# Patient Record
Sex: Male | Born: 1950 | Race: White | Hispanic: No
Health system: Southern US, Community
[De-identification: ages and names within clinical notes are randomized; demographics above are authoritative.]

---

## 2010-05-12 ENCOUNTER — Ambulatory Visit: Payer: Self-pay | Admitting: Specialist

## 2011-04-20 ENCOUNTER — Ambulatory Visit: Payer: Self-pay | Admitting: General Surgery

## 2011-04-28 ENCOUNTER — Other Ambulatory Visit: Payer: Self-pay | Admitting: General Surgery

## 2011-04-28 LAB — COMPREHENSIVE METABOLIC PANEL
Alkaline Phosphatase: 96 U/L (ref 50–136)
Anion Gap: 12 (ref 7–16)
Bilirubin,Total: 0.7 mg/dL (ref 0.2–1.0)
Calcium, Total: 9.5 mg/dL (ref 8.5–10.1)
Chloride: 104 mmol/L (ref 98–107)
Co2: 25 mmol/L (ref 21–32)
Creatinine: 0.59 mg/dL — ABNORMAL LOW (ref 0.60–1.30)
EGFR (African American): >60
EGFR (Non-African Amer.): >60
Osmolality: 281 (ref 275–301)
Potassium: 4.5 mmol/L (ref 3.5–5.1)
SGPT (ALT): 81 U/L — ABNORMAL HIGH
Sodium: 141 mmol/L (ref 136–145)

## 2011-04-28 LAB — CBC WITH DIFFERENTIAL/PLATELET
Basophil #: 0 10*3/uL (ref 0.0–0.1)
Basophil %: 0.4 %
Eosinophil %: 1 %
HCT: 49.4 % (ref 40.0–52.0)
HGB: 16.9 g/dL (ref 13.0–18.0)
Lymphocyte #: 2.2 10*3/uL (ref 1.0–3.6)
Lymphocyte %: 18.7 %
MCV: 96 fL (ref 80–100)
Monocyte %: 6.7 %
Platelet: 211 10*3/uL (ref 150–440)
RBC: 5.14 10*6/uL (ref 4.40–5.90)
RDW: 13 % (ref 11.5–14.5)
WBC: 11.7 10*3/uL — ABNORMAL HIGH (ref 3.8–10.6)

## 2011-04-29 LAB — CEA: CEA: 19.1 ng/mL — ABNORMAL HIGH (ref 0.0–4.7)

## 2011-05-01 ENCOUNTER — Ambulatory Visit: Payer: Self-pay | Admitting: General Surgery

## 2011-05-05 ENCOUNTER — Ambulatory Visit: Payer: Self-pay | Admitting: Oncology

## 2011-05-14 LAB — CBC CANCER CENTER
Basophil #: 0 x10 3/mm (ref 0.0–0.1)
Eosinophil %: 1.6 %
HCT: 46.3 % (ref 40.0–52.0)
Lymphocyte #: 2.2 x10 3/mm (ref 1.0–3.6)
Lymphocyte %: 23.9 %
MCV: 96 fL (ref 80–100)
Monocyte %: 11.3 %
Neutrophil #: 5.8 x10 3/mm (ref 1.4–6.5)
RBC: 4.84 10*6/uL (ref 4.40–5.90)
WBC: 9.2 x10 3/mm (ref 3.8–10.6)

## 2011-05-14 LAB — COMPREHENSIVE METABOLIC PANEL
Anion Gap: 9 (ref 7–16)
Calcium, Total: 9.2 mg/dL (ref 8.5–10.1)
Chloride: 101 mmol/L (ref 98–107)
EGFR (African American): >60
Glucose: 165 mg/dL — ABNORMAL HIGH (ref 65–99)
Osmolality: 278 (ref 275–301)
Potassium: 4.4 mmol/L (ref 3.5–5.1)
SGOT(AST): 32 U/L (ref 15–37)
Sodium: 138 mmol/L (ref 136–145)

## 2011-05-21 LAB — COMPREHENSIVE METABOLIC PANEL
Albumin: 3.2 g/dL — ABNORMAL LOW (ref 3.4–5.0)
Alkaline Phosphatase: 156 U/L — ABNORMAL HIGH (ref 50–136)
BUN: 10 mg/dL (ref 7–18)
Bilirubin,Total: 0.5 mg/dL (ref 0.2–1.0)
Calcium, Total: 9.3 mg/dL (ref 8.5–10.1)
Co2: 28 mmol/L (ref 21–32)
Creatinine: 0.89 mg/dL (ref 0.60–1.30)
EGFR (Non-African Amer.): >60
Glucose: 182 mg/dL — ABNORMAL HIGH (ref 65–99)
Osmolality: 274 (ref 275–301)
SGOT(AST): 28 U/L (ref 15–37)
SGPT (ALT): 51 U/L
Sodium: 135 mmol/L — ABNORMAL LOW (ref 136–145)

## 2011-05-21 LAB — CBC CANCER CENTER
Eosinophil %: 1.4 %
HCT: 44.9 % (ref 40.0–52.0)
HGB: 15.3 g/dL (ref 13.0–18.0)
Lymphocyte #: 2 x10 3/mm (ref 1.0–3.6)
Lymphocyte %: 23.8 %
Monocyte #: 1 x10 3/mm — ABNORMAL HIGH (ref 0.0–0.7)
RBC: 4.69 10*6/uL (ref 4.40–5.90)
WBC: 8.4 x10 3/mm (ref 3.8–10.6)

## 2011-05-28 LAB — CBC CANCER CENTER
Basophil #: 0.1 x10 3/mm (ref 0.0–0.1)
Eosinophil #: 0.1 x10 3/mm (ref 0.0–0.7)
Eosinophil %: 1 %
HCT: 45.1 % (ref 40.0–52.0)
Lymphocyte %: 28.6 %
Monocyte %: 15.5 %
Neutrophil #: 2.8 x10 3/mm (ref 1.4–6.5)
Platelet: 285 x10 3/mm (ref 150–440)
RBC: 4.76 10*6/uL (ref 4.40–5.90)
RDW: 12.8 % (ref 11.5–14.5)
WBC: 5.1 x10 3/mm (ref 3.8–10.6)

## 2011-05-28 LAB — COMPREHENSIVE METABOLIC PANEL
Anion Gap: 12 (ref 7–16)
BUN: 8 mg/dL (ref 7–18)
Calcium, Total: 9.2 mg/dL (ref 8.5–10.1)
Chloride: 101 mmol/L (ref 98–107)
Creatinine: 0.71 mg/dL (ref 0.60–1.30)
EGFR (African American): >60
EGFR (Non-African Amer.): >60
Glucose: 143 mg/dL — ABNORMAL HIGH (ref 65–99)
Osmolality: 275 (ref 275–301)
Potassium: 4.2 mmol/L (ref 3.5–5.1)
SGOT(AST): 29 U/L (ref 15–37)
Total Protein: 7.4 g/dL (ref 6.4–8.2)

## 2011-05-29 ENCOUNTER — Ambulatory Visit: Payer: Self-pay | Admitting: Oncology

## 2011-05-29 LAB — CEA: CEA: 41.8 ng/mL — ABNORMAL HIGH (ref 0.0–4.7)

## 2011-05-29 LAB — CANCER ANTIGEN 19-9: CA 19-9: 270 U/mL — ABNORMAL HIGH (ref 0–35)

## 2011-06-16 ENCOUNTER — Ambulatory Visit: Payer: Self-pay | Admitting: General Surgery

## 2011-06-29 ENCOUNTER — Ambulatory Visit: Payer: Self-pay | Admitting: Oncology

## 2011-07-06 ENCOUNTER — Ambulatory Visit: Payer: Self-pay | Admitting: Oncology

## 2011-07-09 LAB — COMPREHENSIVE METABOLIC PANEL
Alkaline Phosphatase: 124 U/L (ref 50–136)
BUN: 9 mg/dL (ref 7–18)
Bilirubin,Total: 0.4 mg/dL (ref 0.2–1.0)
Calcium, Total: 8.9 mg/dL (ref 8.5–10.1)
Co2: 23 mmol/L (ref 21–32)
Creatinine: 0.76 mg/dL (ref 0.60–1.30)
EGFR (Non-African Amer.): >60
Glucose: 169 mg/dL — ABNORMAL HIGH (ref 65–99)
Osmolality: 280 (ref 275–301)
Potassium: 3.8 mmol/L (ref 3.5–5.1)
SGOT(AST): 50 U/L — ABNORMAL HIGH (ref 15–37)
SGPT (ALT): 66 U/L
Sodium: 139 mmol/L (ref 136–145)

## 2011-07-09 LAB — CBC CANCER CENTER
Basophil #: 0 x10 3/mm (ref 0.0–0.1)
Basophil %: 0.1 %
Eosinophil #: 0 x10 3/mm (ref 0.0–0.7)
HCT: 40.1 % (ref 40.0–52.0)
HGB: 13.3 g/dL (ref 13.0–18.0)
Lymphocyte %: 20.2 %
MCH: 32.6 pg (ref 26.0–34.0)
MCHC: 33.1 g/dL (ref 32.0–36.0)
Monocyte %: 8.8 %
Platelet: 207 x10 3/mm (ref 150–440)
RBC: 4.07 10*6/uL — ABNORMAL LOW (ref 4.40–5.90)
RDW: 15.6 % — ABNORMAL HIGH (ref 11.5–14.5)
WBC: 7.4 x10 3/mm (ref 3.8–10.6)

## 2011-07-16 LAB — COMPREHENSIVE METABOLIC PANEL
Alkaline Phosphatase: 122 U/L (ref 50–136)
BUN: 7 mg/dL (ref 7–18)
Bilirubin,Total: 0.4 mg/dL (ref 0.2–1.0)
Chloride: 105 mmol/L (ref 98–107)
Creatinine: 0.61 mg/dL (ref 0.60–1.30)
EGFR (African American): >60
EGFR (Non-African Amer.): >60
Glucose: 122 mg/dL — ABNORMAL HIGH (ref 65–99)
Osmolality: 279 (ref 275–301)
Potassium: 3.9 mmol/L (ref 3.5–5.1)
Sodium: 140 mmol/L (ref 136–145)
Total Protein: 7.6 g/dL (ref 6.4–8.2)

## 2011-07-16 LAB — CBC CANCER CENTER
Basophil #: 0 x10 3/mm (ref 0.0–0.1)
Basophil %: 0.7 %
Eosinophil #: 0.1 x10 3/mm (ref 0.0–0.7)
Eosinophil %: 1.1 %
HCT: 41.3 % (ref 40.0–52.0)
Lymphocyte #: 1.3 x10 3/mm (ref 1.0–3.6)
Lymphocyte %: 21.2 %
MCH: 32.5 pg (ref 26.0–34.0)
MCHC: 33.5 g/dL (ref 32.0–36.0)
MCV: 97 fL (ref 80–100)
Platelet: 211 x10 3/mm (ref 150–440)
RDW: 15.8 % — ABNORMAL HIGH (ref 11.5–14.5)
WBC: 6.2 x10 3/mm (ref 3.8–10.6)

## 2011-07-17 LAB — CEA: CEA: 4.5 ng/mL (ref 0.0–4.7)

## 2011-07-23 LAB — COMPREHENSIVE METABOLIC PANEL
Albumin: 3.5 g/dL (ref 3.4–5.0)
Alkaline Phosphatase: 163 U/L — ABNORMAL HIGH (ref 50–136)
Anion Gap: 12 (ref 7–16)
BUN: 9 mg/dL (ref 7–18)
Bilirubin,Total: 0.4 mg/dL (ref 0.2–1.0)
Calcium, Total: 9.4 mg/dL (ref 8.5–10.1)
Chloride: 100 mmol/L (ref 98–107)
Co2: 26 mmol/L (ref 21–32)
Creatinine: 0.65 mg/dL (ref 0.60–1.30)
EGFR (African American): >60
Glucose: 161 mg/dL — ABNORMAL HIGH (ref 65–99)
Osmolality: 278 (ref 275–301)
SGOT(AST): 42 U/L — ABNORMAL HIGH (ref 15–37)
Sodium: 138 mmol/L (ref 136–145)
Total Protein: 7.2 g/dL (ref 6.4–8.2)

## 2011-07-23 LAB — CBC CANCER CENTER
Basophil #: 0 x10 3/mm (ref 0.0–0.1)
Eosinophil #: 0 x10 3/mm (ref 0.0–0.7)
Eosinophil %: 0.6 %
Lymphocyte #: 1.7 x10 3/mm (ref 1.0–3.6)
Lymphocyte %: 23.9 %
MCH: 32 pg (ref 26.0–34.0)
MCV: 98 fL (ref 80–100)
Monocyte #: 2.2 x10 3/mm — ABNORMAL HIGH (ref 0.2–1.0)
Monocyte %: 30.1 %
Neutrophil %: 44.7 %
RBC: 3.93 10*6/uL — ABNORMAL LOW (ref 4.40–5.90)
WBC: 7.3 x10 3/mm (ref 3.8–10.6)

## 2011-07-29 ENCOUNTER — Ambulatory Visit: Payer: Self-pay | Admitting: Oncology

## 2011-08-06 LAB — CBC CANCER CENTER
Basophil %: 0.5 %
Eosinophil #: 0 x10 3/mm (ref 0.0–0.7)
HGB: 12.9 g/dL — ABNORMAL LOW (ref 13.0–18.0)
Lymphocyte #: 1.6 x10 3/mm (ref 1.0–3.6)
Lymphocyte %: 17.8 %
MCH: 33.1 pg (ref 26.0–34.0)
MCV: 97 fL (ref 80–100)
Monocyte %: 16 %
Platelet: 197 x10 3/mm (ref 150–440)
RBC: 3.89 10*6/uL — ABNORMAL LOW (ref 4.40–5.90)
RDW: 16.4 % — ABNORMAL HIGH (ref 11.5–14.5)

## 2011-08-06 LAB — COMPREHENSIVE METABOLIC PANEL
Albumin: 3.4 g/dL (ref 3.4–5.0)
Anion Gap: 12 (ref 7–16)
Chloride: 103 mmol/L (ref 98–107)
Creatinine: 0.59 mg/dL — ABNORMAL LOW (ref 0.60–1.30)
EGFR (African American): >60
Osmolality: 282 (ref 275–301)
SGOT(AST): 57 U/L — ABNORMAL HIGH (ref 15–37)
SGPT (ALT): 79 U/L — ABNORMAL HIGH
Sodium: 140 mmol/L (ref 136–145)
Total Protein: 7.3 g/dL (ref 6.4–8.2)

## 2011-08-29 ENCOUNTER — Ambulatory Visit: Payer: Self-pay | Admitting: Oncology

## 2011-09-17 LAB — COMPREHENSIVE METABOLIC PANEL
Albumin: 3.4 g/dL (ref 3.4–5.0)
Anion Gap: 9 (ref 7–16)
BUN: 7 mg/dL (ref 7–18)
Bilirubin,Total: 0.6 mg/dL (ref 0.2–1.0)
Chloride: 103 mmol/L (ref 98–107)
Co2: 25 mmol/L (ref 21–32)
Creatinine: 0.59 mg/dL — ABNORMAL LOW (ref 0.60–1.30)
EGFR (African American): >60
EGFR (Non-African Amer.): >60
Glucose: 102 mg/dL — ABNORMAL HIGH (ref 65–99)
Osmolality: 272 (ref 275–301)
SGOT(AST): 70 U/L — ABNORMAL HIGH (ref 15–37)
Sodium: 137 mmol/L (ref 136–145)
Total Protein: 7.6 g/dL (ref 6.4–8.2)

## 2011-09-17 LAB — CBC CANCER CENTER
Basophil %: 0.4 %
Eosinophil #: 0.1 x10 3/mm (ref 0.0–0.7)
HCT: 36.7 % — ABNORMAL LOW (ref 40.0–52.0)
HGB: 12.5 g/dL — ABNORMAL LOW (ref 13.0–18.0)
Lymphocyte #: 1.6 x10 3/mm (ref 1.0–3.6)
Lymphocyte %: 26 %
MCHC: 34.1 g/dL (ref 32.0–36.0)
MCV: 101 fL — ABNORMAL HIGH (ref 80–100)
Neutrophil %: 60.5 %
Platelet: 219 x10 3/mm (ref 150–440)
RBC: 3.64 10*6/uL — ABNORMAL LOW (ref 4.40–5.90)
WBC: 6.2 x10 3/mm (ref 3.8–10.6)

## 2011-09-18 LAB — CANCER ANTIGEN 19-9: CA 19-9: 104 U/mL — ABNORMAL HIGH (ref 0–35)

## 2011-09-18 LAB — CEA: CEA: 4.6 ng/mL (ref 0.0–4.7)

## 2011-09-28 ENCOUNTER — Ambulatory Visit: Payer: Self-pay | Admitting: Oncology

## 2011-11-04 ENCOUNTER — Ambulatory Visit: Payer: Self-pay | Admitting: Oncology

## 2011-11-16 LAB — COMPREHENSIVE METABOLIC PANEL
Albumin: 3.4 g/dL (ref 3.4–5.0)
Alkaline Phosphatase: 107 U/L (ref 50–136)
BUN: 8 mg/dL (ref 7–18)
Chloride: 105 mmol/L (ref 98–107)
Creatinine: 0.76 mg/dL (ref 0.60–1.30)
EGFR (African American): >60
Glucose: 147 mg/dL — ABNORMAL HIGH (ref 65–99)
Potassium: 3.9 mmol/L (ref 3.5–5.1)
SGOT(AST): 35 U/L (ref 15–37)
SGPT (ALT): 64 U/L (ref 12–78)
Total Protein: 7.3 g/dL (ref 6.4–8.2)

## 2011-11-16 LAB — CBC CANCER CENTER
Basophil %: 0.6 %
Eosinophil #: 0.1 x10 3/mm (ref 0.0–0.7)
Eosinophil %: 1.5 %
HCT: 38.2 % — ABNORMAL LOW (ref 40.0–52.0)
HGB: 12.9 g/dL — ABNORMAL LOW (ref 13.0–18.0)
Lymphocyte %: 23.8 %
MCH: 33.2 pg (ref 26.0–34.0)
Monocyte %: 8 %
Neutrophil #: 4 x10 3/mm (ref 1.4–6.5)
Neutrophil %: 66.1 %
Platelet: 172 x10 3/mm (ref 150–440)
RBC: 3.89 10*6/uL — ABNORMAL LOW (ref 4.40–5.90)
WBC: 6.1 x10 3/mm (ref 3.8–10.6)

## 2011-11-26 ENCOUNTER — Ambulatory Visit: Payer: Self-pay | Admitting: Oncology

## 2011-11-29 ENCOUNTER — Ambulatory Visit: Payer: Self-pay | Admitting: Oncology

## 2011-12-04 LAB — COMPREHENSIVE METABOLIC PANEL
Albumin: 3.5 g/dL (ref 3.4–5.0)
Anion Gap: 7 (ref 7–16)
BUN: 7 mg/dL (ref 7–18)
Bilirubin,Total: 0.3 mg/dL (ref 0.2–1.0)
Chloride: 102 mmol/L (ref 98–107)
Creatinine: 0.77 mg/dL (ref 0.60–1.30)
Glucose: 140 mg/dL — ABNORMAL HIGH (ref 65–99)
Potassium: 4.5 mmol/L (ref 3.5–5.1)
SGOT(AST): 51 U/L — ABNORMAL HIGH (ref 15–37)
Total Protein: 7.4 g/dL (ref 6.4–8.2)

## 2011-12-04 LAB — CBC CANCER CENTER
Eosinophil #: 0.1 x10 3/mm (ref 0.0–0.7)
Eosinophil %: 1.3 %
Lymphocyte #: 1.4 x10 3/mm (ref 1.0–3.6)
MCH: 32.2 pg (ref 26.0–34.0)
MCV: 97 fL (ref 80–100)
Monocyte #: 0.6 x10 3/mm (ref 0.2–1.0)
Neutrophil %: 59.4 %
Platelet: 177 x10 3/mm (ref 150–440)
RDW: 12.8 % (ref 11.5–14.5)

## 2011-12-11 LAB — COMPREHENSIVE METABOLIC PANEL
Anion Gap: 8 (ref 7–16)
Bilirubin,Total: 0.4 mg/dL (ref 0.2–1.0)
Calcium, Total: 8.9 mg/dL (ref 8.5–10.1)
Chloride: 105 mmol/L (ref 98–107)
Co2: 26 mmol/L (ref 21–32)
EGFR (African American): >60
EGFR (Non-African Amer.): >60
Potassium: 4.1 mmol/L (ref 3.5–5.1)
SGOT(AST): 42 U/L — ABNORMAL HIGH (ref 15–37)
Sodium: 139 mmol/L (ref 136–145)

## 2011-12-11 LAB — CBC CANCER CENTER
Basophil #: 0.1 x10 3/mm (ref 0.0–0.1)
Eosinophil #: 0.1 x10 3/mm (ref 0.0–0.7)
HCT: 39.4 % — ABNORMAL LOW (ref 40.0–52.0)
Lymphocyte %: 21.6 %
MCH: 32.1 pg (ref 26.0–34.0)
MCHC: 33.5 g/dL (ref 32.0–36.0)
Monocyte #: 0.6 x10 3/mm (ref 0.2–1.0)
Monocyte %: 8.2 %
Neutrophil #: 4.8 x10 3/mm (ref 1.4–6.5)
RDW: 13.1 % (ref 11.5–14.5)
WBC: 7.1 x10 3/mm (ref 3.8–10.6)

## 2011-12-18 LAB — COMPREHENSIVE METABOLIC PANEL
Albumin: 3.1 g/dL — ABNORMAL LOW (ref 3.4–5.0)
Alkaline Phosphatase: 104 U/L (ref 50–136)
Anion Gap: 6 — ABNORMAL LOW (ref 7–16)
BUN: 6 mg/dL — ABNORMAL LOW (ref 7–18)
Bilirubin,Total: 0.4 mg/dL (ref 0.2–1.0)
Calcium, Total: 8.8 mg/dL (ref 8.5–10.1)
Chloride: 102 mmol/L (ref 98–107)
Co2: 30 mmol/L (ref 21–32)
Creatinine: 0.75 mg/dL (ref 0.60–1.30)
EGFR (African American): >60
Osmolality: 276 (ref 275–301)
Potassium: 4.5 mmol/L (ref 3.5–5.1)
Sodium: 138 mmol/L (ref 136–145)
Total Protein: 7.1 g/dL (ref 6.4–8.2)

## 2011-12-18 LAB — CBC CANCER CENTER
Basophil #: 0 x10 3/mm (ref 0.0–0.1)
Basophil %: 0.5 %
Eosinophil #: 0 x10 3/mm (ref 0.0–0.7)
Eosinophil %: 0.5 %
HCT: 33.6 % — ABNORMAL LOW (ref 40.0–52.0)
HGB: 11.4 g/dL — ABNORMAL LOW (ref 13.0–18.0)
MCH: 31.9 pg (ref 26.0–34.0)
MCHC: 33.9 g/dL (ref 32.0–36.0)
Monocyte #: 0.3 x10 3/mm (ref 0.2–1.0)
Neutrophil #: 2.1 x10 3/mm (ref 1.4–6.5)
Neutrophil %: 62.7 %
RBC: 3.57 10*6/uL — ABNORMAL LOW (ref 4.40–5.90)

## 2011-12-25 LAB — CBC CANCER CENTER
Basophil %: 0.6 %
Eosinophil %: 1.4 %
HCT: 32.5 % — ABNORMAL LOW (ref 40.0–52.0)
HGB: 11.1 g/dL — ABNORMAL LOW (ref 13.0–18.0)
Lymphocyte %: 39.2 %
MCH: 32.6 pg (ref 26.0–34.0)
MCV: 96 fL (ref 80–100)
Monocyte %: 15.6 %
Neutrophil #: 1.8 x10 3/mm (ref 1.4–6.5)
Platelet: 165 x10 3/mm (ref 150–440)
RBC: 3.4 10*6/uL — ABNORMAL LOW (ref 4.40–5.90)
WBC: 4.1 x10 3/mm (ref 3.8–10.6)

## 2011-12-25 LAB — COMPREHENSIVE METABOLIC PANEL
Albumin: 3.2 g/dL — ABNORMAL LOW (ref 3.4–5.0)
Anion Gap: 8 (ref 7–16)
BUN: 5 mg/dL — ABNORMAL LOW (ref 7–18)
Bilirubin,Total: 0.4 mg/dL (ref 0.2–1.0)
Creatinine: 0.78 mg/dL (ref 0.60–1.30)
Glucose: 144 mg/dL — ABNORMAL HIGH (ref 65–99)
Potassium: 4 mmol/L (ref 3.5–5.1)
SGOT(AST): 167 U/L — ABNORMAL HIGH (ref 15–37)
Total Protein: 6.8 g/dL (ref 6.4–8.2)

## 2011-12-29 ENCOUNTER — Ambulatory Visit: Payer: Self-pay | Admitting: Oncology

## 2011-12-30 LAB — CBC CANCER CENTER
Basophil #: 0 x10 3/mm (ref 0.0–0.1)
Eosinophil %: 0.6 %
Lymphocyte %: 49.5 %
MCH: 32.6 pg (ref 26.0–34.0)
MCHC: 34.1 g/dL (ref 32.0–36.0)
MCV: 96 fL (ref 80–100)
Monocyte #: 0.1 x10 3/mm — ABNORMAL LOW (ref 0.2–1.0)
Monocyte %: 3.2 %
Neutrophil %: 46.2 %
Platelet: 135 x10 3/mm — ABNORMAL LOW (ref 150–440)
RBC: 3.14 10*6/uL — ABNORMAL LOW (ref 4.40–5.90)
RDW: 13.7 % (ref 11.5–14.5)
WBC: 2.3 x10 3/mm — ABNORMAL LOW (ref 3.8–10.6)

## 2011-12-30 LAB — BASIC METABOLIC PANEL
Anion Gap: 11 (ref 7–16)
BUN: 10 mg/dL (ref 7–18)
Calcium, Total: 8.5 mg/dL (ref 8.5–10.1)
Chloride: 99 mmol/L (ref 98–107)
Co2: 23 mmol/L (ref 21–32)
Creatinine: 0.73 mg/dL (ref 0.60–1.30)
EGFR (African American): >60
Osmolality: 267 (ref 275–301)
Potassium: 3.6 mmol/L (ref 3.5–5.1)

## 2012-01-01 LAB — CBC CANCER CENTER
Basophil %: 0.6 %
Eosinophil #: 0 x10 3/mm (ref 0.0–0.7)
Eosinophil %: 0.1 %
HCT: 33.3 % — ABNORMAL LOW (ref 40.0–52.0)
HGB: 11.1 g/dL — ABNORMAL LOW (ref 13.0–18.0)
Lymphocyte %: 26.4 %
MCH: 32.2 pg (ref 26.0–34.0)
MCHC: 33.5 g/dL (ref 32.0–36.0)
MCV: 96 fL (ref 80–100)
Monocyte #: 0.4 x10 3/mm (ref 0.2–1.0)
Monocyte %: 10.8 %
Neutrophil #: 2.1 x10 3/mm (ref 1.4–6.5)
Neutrophil %: 62.1 %
RBC: 3.46 10*6/uL — ABNORMAL LOW (ref 4.40–5.90)
RDW: 13.2 % (ref 11.5–14.5)

## 2012-01-01 LAB — COMPREHENSIVE METABOLIC PANEL
Alkaline Phosphatase: 146 U/L — ABNORMAL HIGH (ref 50–136)
BUN: 8 mg/dL (ref 7–18)
Bilirubin,Total: 0.9 mg/dL (ref 0.2–1.0)
Calcium, Total: 9 mg/dL (ref 8.5–10.1)
Creatinine: 0.67 mg/dL (ref 0.60–1.30)
EGFR (African American): >60
EGFR (Non-African Amer.): >60
Glucose: 126 mg/dL — ABNORMAL HIGH (ref 65–99)
Osmolality: 277 (ref 275–301)
SGOT(AST): 244 U/L — ABNORMAL HIGH (ref 15–37)
SGPT (ALT): 367 U/L — ABNORMAL HIGH (ref 12–78)
Total Protein: 7 g/dL (ref 6.4–8.2)

## 2012-01-08 LAB — COMPREHENSIVE METABOLIC PANEL
Albumin: 3 g/dL — ABNORMAL LOW (ref 3.4–5.0)
Alkaline Phosphatase: 230 U/L — ABNORMAL HIGH (ref 50–136)
BUN: 6 mg/dL — ABNORMAL LOW (ref 7–18)
Bilirubin,Total: 1 mg/dL (ref 0.2–1.0)
Creatinine: 0.73 mg/dL (ref 0.60–1.30)
EGFR (African American): >60
EGFR (Non-African Amer.): >60
Glucose: 118 mg/dL — ABNORMAL HIGH (ref 65–99)
SGOT(AST): 142 U/L — ABNORMAL HIGH (ref 15–37)
SGPT (ALT): 208 U/L — ABNORMAL HIGH (ref 12–78)
Total Protein: 6.7 g/dL (ref 6.4–8.2)

## 2012-01-08 LAB — CBC CANCER CENTER
Basophil #: 0 x10 3/mm (ref 0.0–0.1)
Basophil %: 0.2 %
Eosinophil %: 0.8 %
HCT: 34.7 % — ABNORMAL LOW (ref 40.0–52.0)
HGB: 11.6 g/dL — ABNORMAL LOW (ref 13.0–18.0)
Lymphocyte #: 1.5 x10 3/mm (ref 1.0–3.6)
MCH: 33.1 pg (ref 26.0–34.0)
MCV: 99 fL (ref 80–100)
Monocyte %: 26.9 %
Neutrophil #: 2.3 x10 3/mm (ref 1.4–6.5)
RBC: 3.51 10*6/uL — ABNORMAL LOW (ref 4.40–5.90)
WBC: 5.3 x10 3/mm (ref 3.8–10.6)

## 2012-01-15 LAB — CBC CANCER CENTER
Basophil %: 1.3 %
Eosinophil %: 0.2 %
HCT: 42.9 % (ref 40.0–52.0)
HGB: 14.1 g/dL (ref 13.0–18.0)
Lymphocyte #: 1.4 x10 3/mm (ref 1.0–3.6)
MCH: 33.4 pg (ref 26.0–34.0)
MCV: 102 fL — ABNORMAL HIGH (ref 80–100)
Monocyte #: 0.6 x10 3/mm (ref 0.2–1.0)
Monocyte %: 8.2 %
Neutrophil %: 71.8 %
Platelet: 229 x10 3/mm (ref 150–440)
RBC: 4.21 10*6/uL — ABNORMAL LOW (ref 4.40–5.90)
WBC: 7.4 x10 3/mm (ref 3.8–10.6)

## 2012-01-15 LAB — HEPATIC FUNCTION PANEL A (ARMC)
Albumin: 3.1 g/dL — ABNORMAL LOW (ref 3.4–5.0)
Alkaline Phosphatase: 238 U/L — ABNORMAL HIGH (ref 50–136)
Bilirubin, Direct: 0.4 mg/dL — ABNORMAL HIGH (ref 0.00–0.20)
Bilirubin,Total: 1 mg/dL (ref 0.2–1.0)
SGPT (ALT): 118 U/L — ABNORMAL HIGH (ref 12–78)
Total Protein: 7.3 g/dL (ref 6.4–8.2)

## 2012-01-22 LAB — COMPREHENSIVE METABOLIC PANEL
Albumin: 2.7 g/dL — ABNORMAL LOW (ref 3.4–5.0)
Anion Gap: 12 (ref 7–16)
Bilirubin,Total: 0.6 mg/dL (ref 0.2–1.0)
Calcium, Total: 8.1 mg/dL — ABNORMAL LOW (ref 8.5–10.1)
Chloride: 104 mmol/L (ref 98–107)
Co2: 22 mmol/L (ref 21–32)
EGFR (African American): >60
Osmolality: 277 (ref 275–301)
Potassium: 4.1 mmol/L (ref 3.5–5.1)
SGOT(AST): 46 U/L — ABNORMAL HIGH (ref 15–37)
SGPT (ALT): 64 U/L (ref 12–78)
Total Protein: 6.1 g/dL — ABNORMAL LOW (ref 6.4–8.2)

## 2012-01-22 LAB — CBC CANCER CENTER
Basophil #: 0.1 x10 3/mm (ref 0.0–0.1)
Basophil %: 1.4 %
Eosinophil #: 0.1 x10 3/mm (ref 0.0–0.7)
HCT: 37.4 % — ABNORMAL LOW (ref 40.0–52.0)
Lymphocyte #: 1.7 x10 3/mm (ref 1.0–3.6)
MCV: 103 fL — ABNORMAL HIGH (ref 80–100)
Monocyte #: 1.1 x10 3/mm — ABNORMAL HIGH (ref 0.2–1.0)
Monocyte %: 16 %
Neutrophil #: 3.7 x10 3/mm (ref 1.4–6.5)
RDW: 20.9 % — ABNORMAL HIGH (ref 11.5–14.5)
WBC: 6.6 x10 3/mm (ref 3.8–10.6)

## 2012-01-23 LAB — CANCER ANTIGEN 19-9: CA 19-9: 489 U/mL — ABNORMAL HIGH (ref 0–35)

## 2012-01-29 ENCOUNTER — Ambulatory Visit: Payer: Self-pay | Admitting: Oncology

## 2012-01-29 LAB — CBC CANCER CENTER
Eosinophil #: 0.1 x10 3/mm (ref 0.0–0.7)
Eosinophil %: 2.4 %
Lymphocyte #: 2.4 x10 3/mm (ref 1.0–3.6)
Lymphocyte %: 50.4 %
MCH: 34 pg (ref 26.0–34.0)
MCV: 103 fL — ABNORMAL HIGH (ref 80–100)
Monocyte #: 0.3 x10 3/mm (ref 0.2–1.0)
Neutrophil #: 1.9 x10 3/mm (ref 1.4–6.5)
Neutrophil %: 40.2 %
Platelet: 70 x10 3/mm — ABNORMAL LOW (ref 150–440)
RBC: 3.77 10*6/uL — ABNORMAL LOW (ref 4.40–5.90)
RDW: 19.5 % — ABNORMAL HIGH (ref 11.5–14.5)

## 2012-01-29 LAB — COMPREHENSIVE METABOLIC PANEL
Albumin: 3 g/dL — ABNORMAL LOW (ref 3.4–5.0)
Alkaline Phosphatase: 151 U/L — ABNORMAL HIGH (ref 50–136)
Anion Gap: 12 (ref 7–16)
BUN: 11 mg/dL (ref 7–18)
Calcium, Total: 9.1 mg/dL (ref 8.5–10.1)
Chloride: 100 mmol/L (ref 98–107)
Co2: 23 mmol/L (ref 21–32)
EGFR (African American): >60
Glucose: 104 mg/dL — ABNORMAL HIGH (ref 65–99)
Osmolality: 270 (ref 275–301)
Potassium: 4 mmol/L (ref 3.5–5.1)
SGOT(AST): 67 U/L — ABNORMAL HIGH (ref 15–37)
SGPT (ALT): 97 U/L — ABNORMAL HIGH (ref 12–78)
Sodium: 135 mmol/L — ABNORMAL LOW (ref 136–145)
Total Protein: 6.8 g/dL (ref 6.4–8.2)

## 2012-02-01 LAB — CANCER ANTIGEN 19-9: CA 19-9: 811 U/mL — ABNORMAL HIGH (ref 0–35)

## 2012-02-05 LAB — COMPREHENSIVE METABOLIC PANEL
Alkaline Phosphatase: 161 U/L — ABNORMAL HIGH (ref 50–136)
BUN: 9 mg/dL (ref 7–18)
Bilirubin,Total: 0.4 mg/dL (ref 0.2–1.0)
Chloride: 102 mmol/L (ref 98–107)
Co2: 23 mmol/L (ref 21–32)
Creatinine: 0.62 mg/dL (ref 0.60–1.30)
EGFR (African American): >60
EGFR (Non-African Amer.): >60
Osmolality: 274 (ref 275–301)
SGOT(AST): 44 U/L — ABNORMAL HIGH (ref 15–37)
SGPT (ALT): 65 U/L (ref 12–78)
Sodium: 136 mmol/L (ref 136–145)
Total Protein: 5.9 g/dL — ABNORMAL LOW (ref 6.4–8.2)

## 2012-02-05 LAB — CBC CANCER CENTER
Basophil #: 0 x10 3/mm (ref 0.0–0.1)
Basophil %: 1 %
Eosinophil %: 3.2 %
HCT: 36.6 % — ABNORMAL LOW (ref 40.0–52.0)
Lymphocyte #: 1.6 x10 3/mm (ref 1.0–3.6)
Lymphocyte %: 37.3 %
MCV: 104 fL — ABNORMAL HIGH (ref 80–100)
Monocyte #: 1.3 x10 3/mm — ABNORMAL HIGH (ref 0.2–1.0)
Monocyte %: 30 %
Neutrophil %: 28.5 %
Platelet: 167 x10 3/mm (ref 150–440)
RBC: 3.54 10*6/uL — ABNORMAL LOW (ref 4.40–5.90)
RDW: 20.3 % — ABNORMAL HIGH (ref 11.5–14.5)
WBC: 4.4 x10 3/mm (ref 3.8–10.6)

## 2012-02-08 LAB — CANCER ANTIGEN 19-9: CA 19-9: 625 U/mL — ABNORMAL HIGH (ref 0–35)

## 2012-02-18 ENCOUNTER — Emergency Department: Payer: Self-pay | Admitting: Emergency Medicine

## 2012-02-18 LAB — CBC WITH DIFFERENTIAL/PLATELET
Basophil #: 0 10*3/uL (ref 0.0–0.1)
Basophil %: 0.5 %
HCT: 33.3 % — ABNORMAL LOW (ref 40.0–52.0)
Lymphocyte #: 1.4 10*3/uL (ref 1.0–3.6)
Lymphocyte %: 36 %
MCH: 36.3 pg — ABNORMAL HIGH (ref 26.0–34.0)
MCV: 106 fL — ABNORMAL HIGH (ref 80–100)
Monocyte #: 1.3 x10 3/mm — ABNORMAL HIGH (ref 0.2–1.0)
Monocyte %: 33.9 %
Neutrophil #: 1.1 10*3/uL — ABNORMAL LOW (ref 1.4–6.5)
RBC: 3.14 10*6/uL — ABNORMAL LOW (ref 4.40–5.90)
RDW: 21 % — ABNORMAL HIGH (ref 11.5–14.5)
WBC: 3.8 10*3/uL (ref 3.8–10.6)

## 2012-02-18 LAB — COMPREHENSIVE METABOLIC PANEL
Anion Gap: 10 (ref 7–16)
Bilirubin,Total: 0.3 mg/dL (ref 0.2–1.0)
Chloride: 105 mmol/L (ref 98–107)
Co2: 23 mmol/L (ref 21–32)
Creatinine: 0.7 mg/dL (ref 0.60–1.30)
EGFR (African American): >60
EGFR (Non-African Amer.): >60
Osmolality: 284 (ref 275–301)
Potassium: 4 mmol/L (ref 3.5–5.1)
Sodium: 138 mmol/L (ref 136–145)

## 2012-02-18 LAB — URINALYSIS, COMPLETE
Bilirubin,UR: NEGATIVE
Glucose,UR: 150 mg/dL (ref 0–75)
Ketone: NEGATIVE
Nitrite: NEGATIVE
Ph: 5 (ref 4.5–8.0)
Protein: NEGATIVE
RBC,UR: 1 /HPF (ref 0–5)
Specific Gravity: 1.025 (ref 1.003–1.030)
WBC UR: 2 /HPF (ref 0–5)

## 2012-02-18 LAB — TROPONIN I: Troponin-I: <0.02 ng/mL

## 2012-02-18 LAB — CK TOTAL AND CKMB (NOT AT ARMC): CK, Total: 90 U/L (ref 35–232)

## 2012-02-19 LAB — CBC CANCER CENTER
Basophil %: 1.3 %
Eosinophil %: 1.7 %
HCT: 36.4 % — ABNORMAL LOW (ref 40.0–52.0)
HGB: 12.3 g/dL — ABNORMAL LOW (ref 13.0–18.0)
Lymphocyte %: 39.4 %
MCHC: 33.7 g/dL (ref 32.0–36.0)
Monocyte %: 30.2 %
Neutrophil #: 1.3 x10 3/mm — ABNORMAL LOW (ref 1.4–6.5)
Neutrophil %: 27.4 %
Platelet: 147 x10 3/mm — ABNORMAL LOW (ref 150–440)
RBC: 3.4 10*6/uL — ABNORMAL LOW (ref 4.40–5.90)

## 2012-02-19 LAB — COMPREHENSIVE METABOLIC PANEL
Albumin: 2.7 g/dL — ABNORMAL LOW (ref 3.4–5.0)
Anion Gap: 10 (ref 7–16)
Bilirubin,Total: 0.4 mg/dL (ref 0.2–1.0)
Calcium, Total: 8.3 mg/dL — ABNORMAL LOW (ref 8.5–10.1)
Chloride: 103 mmol/L (ref 98–107)
Co2: 25 mmol/L (ref 21–32)
Creatinine: 0.74 mg/dL (ref 0.60–1.30)
EGFR (Non-African Amer.): >60
Glucose: 206 mg/dL — ABNORMAL HIGH (ref 65–99)
Potassium: 4 mmol/L (ref 3.5–5.1)
SGOT(AST): 36 U/L (ref 15–37)
Sodium: 138 mmol/L (ref 136–145)

## 2012-02-20 LAB — CANCER ANTIGEN 19-9: CA 19-9: 531 U/mL — ABNORMAL HIGH (ref 0–35)

## 2012-02-28 ENCOUNTER — Ambulatory Visit: Payer: Self-pay | Admitting: Oncology

## 2012-03-03 LAB — COMPREHENSIVE METABOLIC PANEL
Albumin: 2.4 g/dL — ABNORMAL LOW (ref 3.4–5.0)
Anion Gap: 10 (ref 7–16)
BUN: 8 mg/dL (ref 7–18)
Calcium, Total: 8.3 mg/dL — ABNORMAL LOW (ref 8.5–10.1)
EGFR (African American): >60
Glucose: 243 mg/dL — ABNORMAL HIGH (ref 65–99)
Potassium: 4.3 mmol/L (ref 3.5–5.1)
SGOT(AST): 43 U/L — ABNORMAL HIGH (ref 15–37)
SGPT (ALT): 77 U/L (ref 12–78)

## 2012-03-03 LAB — CBC CANCER CENTER
Basophil #: 0 x10 3/mm (ref 0.0–0.1)
Eosinophil #: 0 x10 3/mm (ref 0.0–0.7)
HCT: 31.5 % — ABNORMAL LOW (ref 40.0–52.0)
HGB: 11.2 g/dL — ABNORMAL LOW (ref 13.0–18.0)
Lymphocyte #: 0.3 x10 3/mm — ABNORMAL LOW (ref 1.0–3.6)
Monocyte #: 0.2 x10 3/mm (ref 0.2–1.0)
Monocyte %: 11.4 %
Neutrophil #: 1.2 x10 3/mm — ABNORMAL LOW (ref 1.4–6.5)
RDW: 18.2 % — ABNORMAL HIGH (ref 11.5–14.5)

## 2012-03-10 LAB — COMPREHENSIVE METABOLIC PANEL
Albumin: 2.5 g/dL — ABNORMAL LOW (ref 3.4–5.0)
Alkaline Phosphatase: 133 U/L (ref 50–136)
BUN: 13 mg/dL (ref 7–18)
Bilirubin,Total: 0.6 mg/dL (ref 0.2–1.0)
Calcium, Total: 8.3 mg/dL — ABNORMAL LOW (ref 8.5–10.1)
Co2: 22 mmol/L (ref 21–32)
EGFR (Non-African Amer.): >60
Glucose: 249 mg/dL — ABNORMAL HIGH (ref 65–99)
SGOT(AST): 38 U/L — ABNORMAL HIGH (ref 15–37)
Sodium: 137 mmol/L (ref 136–145)

## 2012-03-10 LAB — CBC CANCER CENTER
Basophil %: 0.9 %
HCT: 36.1 % — ABNORMAL LOW (ref 40.0–52.0)
HGB: 12.7 g/dL — ABNORMAL LOW (ref 13.0–18.0)
Lymphocyte %: 12.5 %
MCV: 106 fL — ABNORMAL HIGH (ref 80–100)
Monocyte #: 0.7 x10 3/mm (ref 0.2–1.0)
Monocyte %: 8.3 %
Platelet: 169 x10 3/mm (ref 150–440)
WBC: 8.8 x10 3/mm (ref 3.8–10.6)

## 2012-03-29 ENCOUNTER — Emergency Department: Payer: Self-pay | Admitting: Emergency Medicine

## 2012-03-29 LAB — CBC
HCT: 36.9 % — ABNORMAL LOW (ref 40.0–52.0)
MCHC: 33.1 g/dL (ref 32.0–36.0)
MCV: 105 fL — ABNORMAL HIGH (ref 80–100)
Platelet: 136 10*3/uL — ABNORMAL LOW (ref 150–440)
RDW: 16.1 % — ABNORMAL HIGH (ref 11.5–14.5)
WBC: 13.9 10*3/uL — ABNORMAL HIGH (ref 3.8–10.6)

## 2012-03-29 LAB — COMPREHENSIVE METABOLIC PANEL
Albumin: 2 g/dL — ABNORMAL LOW (ref 3.4–5.0)
Anion Gap: 6 — ABNORMAL LOW (ref 7–16)
BUN: 7 mg/dL (ref 7–18)
Bilirubin,Total: 0.6 mg/dL (ref 0.2–1.0)
Chloride: 103 mmol/L (ref 98–107)
Creatinine: 0.66 mg/dL (ref 0.60–1.30)
EGFR (African American): >60
EGFR (Non-African Amer.): >60
Glucose: 140 mg/dL — ABNORMAL HIGH (ref 65–99)
Potassium: 4.2 mmol/L (ref 3.5–5.1)
SGOT(AST): 45 U/L — ABNORMAL HIGH (ref 15–37)
Sodium: 135 mmol/L — ABNORMAL LOW (ref 136–145)
Total Protein: 5.7 g/dL — ABNORMAL LOW (ref 6.4–8.2)

## 2012-03-29 LAB — PROTIME-INR
INR: 1
Prothrombin Time: 13.9 secs (ref 11.5–14.7)

## 2012-03-29 LAB — CK TOTAL AND CKMB (NOT AT ARMC): CK-MB: 2.9 ng/mL (ref 0.5–3.6)

## 2012-03-30 ENCOUNTER — Ambulatory Visit: Payer: Self-pay | Admitting: Oncology

## 2012-04-07 LAB — CBC CANCER CENTER
Basophil #: 0.1 x10 3/mm (ref 0.0–0.1)
Eosinophil #: 0.1 x10 3/mm (ref 0.0–0.7)
Eosinophil %: 0.8 %
HGB: 12.7 g/dL — ABNORMAL LOW (ref 13.0–18.0)
Lymphocyte #: 2.1 x10 3/mm (ref 1.0–3.6)
Lymphocyte %: 17.8 %
MCHC: 33.6 g/dL (ref 32.0–36.0)
MCV: 105 fL — ABNORMAL HIGH (ref 80–100)
Monocyte #: 1.3 x10 3/mm — ABNORMAL HIGH (ref 0.2–1.0)
Monocyte %: 11 %
Neutrophil #: 8.1 x10 3/mm — ABNORMAL HIGH (ref 1.4–6.5)
Neutrophil %: 69.7 %
Platelet: 175 x10 3/mm (ref 150–440)
RDW: 16.3 % — ABNORMAL HIGH (ref 11.5–14.5)
WBC: 11.6 x10 3/mm — ABNORMAL HIGH (ref 3.8–10.6)

## 2012-04-07 LAB — COMPREHENSIVE METABOLIC PANEL
Alkaline Phosphatase: 174 U/L — ABNORMAL HIGH (ref 50–136)
Anion Gap: 8 (ref 7–16)
Bilirubin,Total: 0.7 mg/dL (ref 0.2–1.0)
Co2: 25 mmol/L (ref 21–32)
Creatinine: 0.94 mg/dL (ref 0.60–1.30)
Glucose: 125 mg/dL — ABNORMAL HIGH (ref 65–99)
Osmolality: 266 (ref 275–301)
SGOT(AST): 54 U/L — ABNORMAL HIGH (ref 15–37)
SGPT (ALT): 63 U/L (ref 12–78)
Total Protein: 6.3 g/dL — ABNORMAL LOW (ref 6.4–8.2)

## 2012-04-08 LAB — CANCER ANTIGEN 19-9: CA 19-9: 669 U/mL — ABNORMAL HIGH (ref 0–35)

## 2012-04-14 LAB — COMPREHENSIVE METABOLIC PANEL
Alkaline Phosphatase: 150 U/L — ABNORMAL HIGH (ref 50–136)
Anion Gap: 8 (ref 7–16)
Bilirubin,Total: 0.6 mg/dL (ref 0.2–1.0)
Calcium, Total: 7.8 mg/dL — ABNORMAL LOW (ref 8.5–10.1)
Chloride: 101 mmol/L (ref 98–107)
Creatinine: 0.64 mg/dL (ref 0.60–1.30)
EGFR (African American): >60
EGFR (Non-African Amer.): >60
Glucose: 150 mg/dL — ABNORMAL HIGH (ref 65–99)
Osmolality: 273 (ref 275–301)
Potassium: 4.2 mmol/L (ref 3.5–5.1)
SGOT(AST): 45 U/L — ABNORMAL HIGH (ref 15–37)
Sodium: 136 mmol/L (ref 136–145)

## 2012-04-14 LAB — CBC CANCER CENTER
Basophil #: 0.1 x10 3/mm (ref 0.0–0.1)
Basophil %: 0.9 %
Eosinophil %: 1.1 %
HCT: 34 % — ABNORMAL LOW (ref 40.0–52.0)
HGB: 11.8 g/dL — ABNORMAL LOW (ref 13.0–18.0)
Lymphocyte #: 1.8 x10 3/mm (ref 1.0–3.6)
Lymphocyte %: 19.4 %
MCHC: 34.7 g/dL (ref 32.0–36.0)
MCV: 104 fL — ABNORMAL HIGH (ref 80–100)
Monocyte #: 1.4 x10 3/mm — ABNORMAL HIGH (ref 0.2–1.0)
Monocyte %: 15.9 %
Platelet: 123 x10 3/mm — ABNORMAL LOW (ref 150–440)
WBC: 9.1 x10 3/mm (ref 3.8–10.6)

## 2012-04-30 ENCOUNTER — Ambulatory Visit: Payer: Self-pay | Admitting: Oncology

## 2012-05-28 DEATH — deceased

## 2013-10-26 IMAGING — CT CT CHEST W/ CM
1 series · 1 of 1 positions shown, 2 images · non-contrast
Comparison: none

REASON FOR EXAM: rt lateral chest wall mass
COMMENTS:

[Series 1006: review_genericreadingtask result images · 0.66mm/px · 1 of 1 slices shown, 2 images]
[im 1/1  mediastinal]
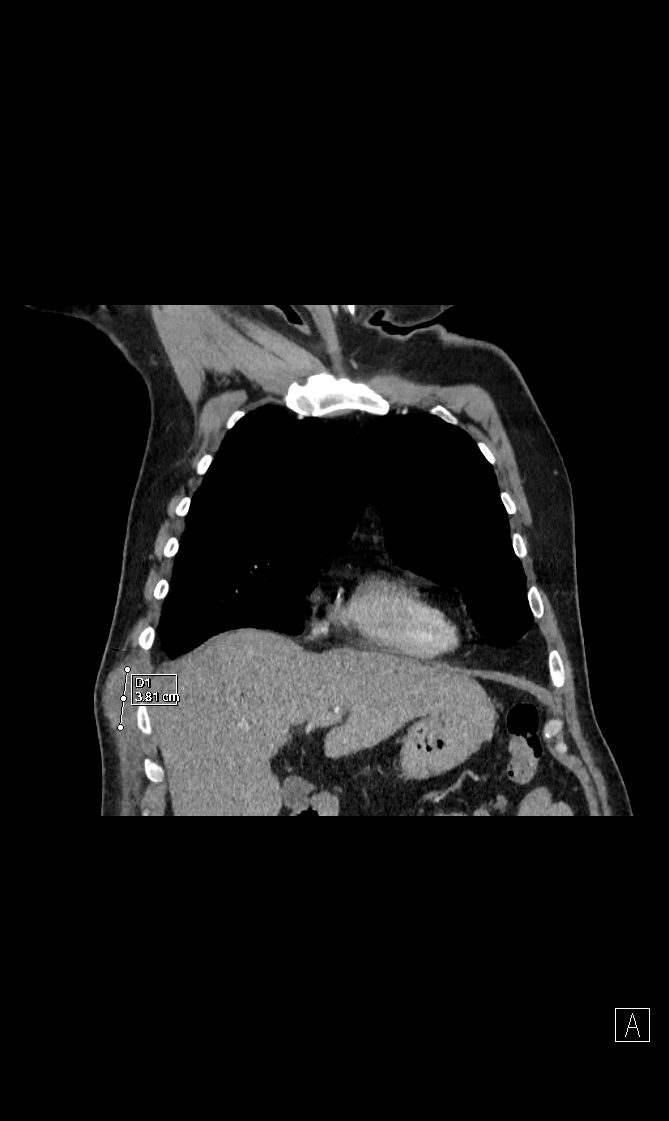
[im 1/1  lung]
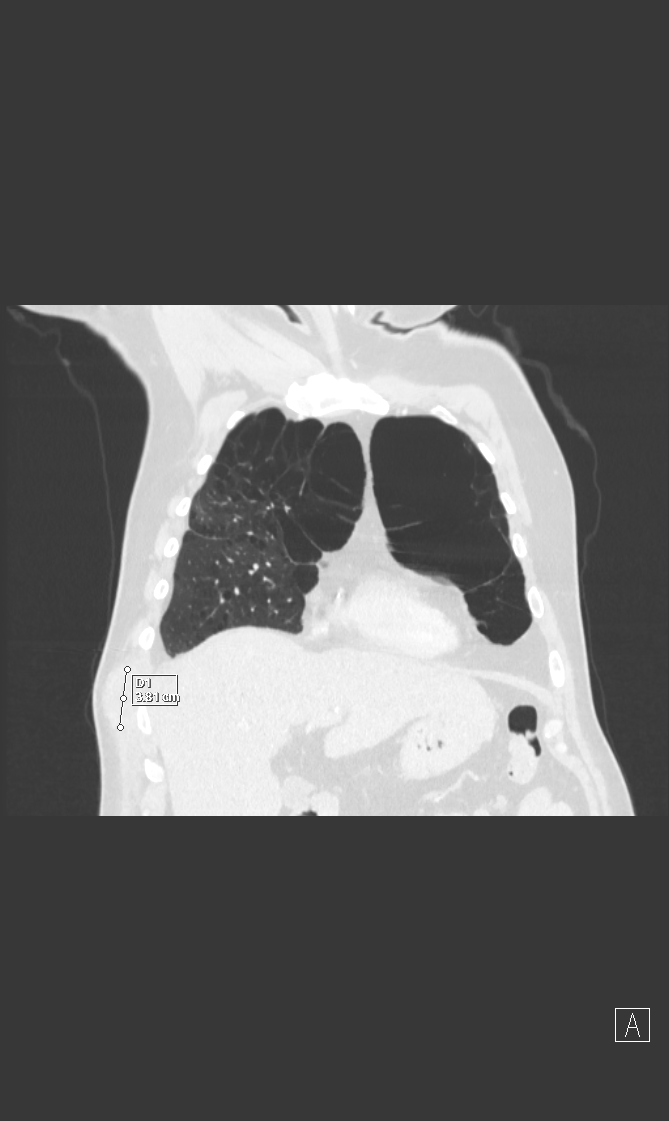

[1 of 1 positions shown; findings below may reference images not displayed]

PROCEDURE:     KCT - KCT CHEST WITH CONTRAST  - April 20, 2011  [DATE]

RESULT:     Axial CT scanning was performed through the chest with
reconstructions at 5 mm intervals and slice thicknesses following
intravenous administration of 75 cc of Xsovue-39J. Review of multiplanar
reconstructed images was performed separately on the VIA monitor.

A metallic BB was placed over the symptomatic right lateral chest wall. Deep
to this there is a faintly rim-enhancing elliptical mass in the subcutaneous
musculature over the right lateral chest wall. This measures 4.1 cm AP by
2.3 cm transversely by 3.8 cm in superior to inferior dimension. It lies
deep to the subcutaneous fat. There is a a small focus of overlying skin
thickening but a definite sinus tract or other communication is not
demonstrated. The lowermost portion of the soft tissue mass lies adjacent to
the lateral aspect of the right eighth rib. I see no periosteal reaction or
lytic lesion in the rib. The structure exhibits Hounsfield measurement of 28
in its center with minimal rim enhancement. I do not see areas of abnormal
soft tissue density within the chest wall.

At lung window settings extensive bullous emphysematous changes are present
chiefly in the upper lobes. I do not see evidence of a pneumothorax
currently. There is pleural thickening in the upper lateral hemithorax on
the right demonstrated best on images 21 through 24. A nodular component is
present here which measures approximately 1.7 cm transversely x 2.3 cm AP.

At mediastinal window settings the cardiac chambers are normal in size.
There is no pleural nor pericardial effusion. I see no pathologic sized
mediastinal or hilar or axillary lymph nodes. The thoracic vertebral bodies
are preserved in height. Within the upper abdomen the observed portions of
the liver and spleen are normal. I see no adrenal masses.
IMPRESSION: 1. There is a minimally rim-enhancing soft tissue density mass in the deep
subcutaneous tissues of the right lateral chest wall deep to the area
marked. This could reflect a fluid-filled or solid mass. The findings may be
related to previous chest tube placement but could be idiopathic. Infectious
as well as neoplastic etiologies or conceivably an organized hematoma could
present in such a fashion.
2. I do not see acute cardiopulmonary abnormality. Extensive chronic
emphysematous change within the lungs is present with large bullous lesions.
No pleural effusion or pneumothorax is seen currently.
3. Superior to the deep subcutaneous mass described in #1
above there is a area of pleural thickening with a nodular component. This
is nonspecific and may be postsurgical but neoplasm cannot be excluded.

## 2014-07-22 NOTE — Consult Note (Signed)
Reason for Visit: This 64 year old Male patient presents to the clinic for initial evaluation of  Metastatic cancer .   Referred by Dr. Orlie Dakin.  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 Histology suggests upper aerodigestive primary.   Pathology Report Pathology report reviewed    Imaging Report PET/CT scan and CT scan reviewed    Referral Report Clinical notes reviewed    Planned Treatment Regimen Palliative radiation therapy to chest    HPI   patient is a 64 year old male whose wife is a former patient of mine with significant COPD. He is status post chest surgery for spontaneous pneumothorax in 1980. Recently he noted a rapidly growing mass in his right lateral chest wall which was extremely painful. CT scan confirmed a rim of enhancing soft tissue mass in the deep subcutaneous tissue the right lateral chest wall. On PET/CT this area was faintly PET positive. Patient underwent excisional biopsy by Dr. Evette Cristal R. showing metastatic poorly differentiated carcinoma compatible with upper gastrointestinal or pancreatic biliary origin. Those areas did not show up as avid on PET/CT. Patient is also to complaining of increasing pain in his left hip region. He does have an area of soft tissue mass in this region which again is mildly PET avid. He specifically denies cough hemoptysis or dysphagia. He is referred to radiation oncology today for consideration of treatment. He has been evaluated by medical oncology in [place] planning systemic chemotherapy.  Past, Family and Social History:   Past Medical History positive    Cardiovascular hyperlipidemia; hypertension    Respiratory asthma; bronchitis; COPD    Endocrine diabetes mellitus    Past Surgical History Rod placement and left knee    Family History noncontributory    Social History positive    Social History Comments Greater  than 100-pack-year smoking history, significant EtOH use history    Additional Past Medical and Surgical History Accompanied by wife today   Allergies:   No Known Allergies:   Home Meds:  Home Medications: Medication Instructions Status  amitriptyline 25 mg tablet 1 tab(s) orally once a day (at bedtime) x 30 days Active  omeprazole 20 mg oral delayed release capsule 1 cap(s) orally once a day Active  amlodipine 5 mg oral tablet 1 tab(s) orally once a day Active  losartan 50 mg oral tablet 1 tab(s) orally once a day Active  metformin 500 mg oral tablet 1 tab in am, 2 tabs in PM Active  clonazepam 1 mg oral tablet 1.5 tab(s) orally once a day (at bedtime) Active  sertraline 50 mg oral tablet 1 tab(s) orally once a day Active  Vicodin 5 mg-500 mg oral tablet 1 tab(s) orally every 4 hours Active  Advair Diskus 250 mcg-50 mcg inhalation powder 1 puff(s) inhaled 2 times a day Active  Spiriva 18 mcg inhalation capsule 1 each inhaled once a day Active  Ventolin HFA 90 mcg/inh inhalation aerosol 2 puff(s) inhaled 4 times a day Active   Review of Systems:   General negative    Performance Status (ECOG) 1    Skin negative    Breast negative    Ophthalmologic negative    ENMT negative    Respiratory and Thorax see HPI    Cardiovascular see HPI    Gastrointestinal negative    Genitourinary negative    Musculoskeletal negative    Neurological negative    Psychiatric negative    Hematology/Lymphatics negative  Endocrine see HPI    Allergic/Immunologic negative   Nursing Notes:  Nursing Vital Signs and Chemo Nursing Nursing Notes: *CC Vital Signs Flowsheet:   05-Feb-13 11:13   Temp Temperature 98   Pulse Pulse 110   Respirations Respirations 22   SBP SBP 122   DBP DBP 75   Pain Scale (0-10)  8   Pulse Oxi  97   Current Weight (kg) (kg) 93.7   Height (cm) centimeters 180   BSA (m2) 2.1   Physical Exam:  General/Skin/HEENT:   General normal    Skin normal     Eyes normal    ENMT normal    Head and Neck normal    Additional PE Well-developed male slightly obese in NAD. Lungs are clear to A&P cardiac examination shows regular rate and rhythm without murmur rub or thrill. Abdomen is benign. He has a flattened soft tissue mass in the right lateral chest wall which is tender to touch. No axillary or supraclavicular adenopathy is appreciated. Range of motion of his lower extremities does not elicit pain. Pelvic tilt is on elicit pain. Motor sensory and DTR levels are equal and symmetric in the upper and lower extremities.   Breasts/Resp/CV/GI/GU:   Respiratory and Thorax normal    Cardiovascular normal    Gastrointestinal normal    Genitourinary normal   MS/Neuro/Psych/Lymph:   Musculoskeletal normal    Neurological normal    Lymphatics normal   Other Results:  Radiology Results: CT:    21-Jan-13 13:55, CT Chest With Contrast   CT Chest With Contrast    REASON FOR EXAM:    rt lateral chest wall mass  COMMENTS:       PROCEDURE: KCT - KCT CHEST WITH CONTRAST  - Apr 20 2011  1:55PM     RESULT: Axial CT scanning was performed through the chest with   reconstructions at 5 mm intervals and slice thicknesses following   intravenous administration of 75 cc of Isovue-370. Review of multiplanar   reconstructed images was performed separately on the VIA monitor.     A metallic BB was placed over the symptomatic right lateral chest wall.   Deep to this there is a faintly rim-enhancing elliptical mass in the   subcutaneous musculature over the right lateral chest wall. This measures   4.1 cm AP by 2.3 cm transversely by 3.8 cm in superior to inferior   dimension. It lies deep to the subcutaneous fat. There is a a small focus     of overlying skin thickening but a definite sinus tract or other   communication is not demonstrated. The lowermost portion of the soft   tissue mass lies adjacent to the lateral aspect of the right eighth rib.    I see no periosteal reaction or lytic lesion in the rib. The structure   exhibits Hounsfield measurement of 28 in its center with minimal rim   enhancement. I do not see areas of abnormal soft tissue density within   the chest wall.    At lung window settings extensive bullous emphysematous changes are   present chiefly in the upper lobes. I do not see evidence of a   pneumothorax currently. There is pleural thickening in the upper lateral   hemithorax on the right demonstrated best on images 21 through 24. A  nodular component is present here which measures approximately 1.7 cm   transversely x 2.3 cm AP.    At mediastinal window settings the cardiac chambers are  normal in size.   There is no pleural nor pericardial effusion. I see no pathologic sized   mediastinal or hilar or axillary lymph nodes. The thoracic vertebral   bodies are preserved in height. Within the upper abdomen the observed   portions of the liver and spleen are normal. I see no adrenal masses.    IMPRESSION:    1. There is a minimally rim-enhancing soft tissue density mass in the   deep subcutaneous tissues of the right lateral chest wall deep to the   area marked. This could reflect a fluid-filled or solid mass. The   findings may be related to previous chest tube placement but could be   idiopathic. Infectious as well as neoplastic etiologies or conceivably an   organized hematoma could present in such a fashion.  2. I do not see acute cardiopulmonary abnormality. Extensive chronic   emphysematous change within the lungsis present with large bullous     lesions. No pleural effusion or pneumothorax is seen currently.  3. Superior to the deep subcutaneous mass described in #1  above there is a area of pleural thickening with a nodular component.   This is nonspecific and may be postsurgical but neoplasm cannot be   excluded.          Verified By: DAVID A. [place], M.D., MD  Nuclear Med:    01-Feb-13 09:40,  PET/CT Scan Lung Cancer Diagnosis   PET/CT Scan Lung Cancer Diagnosis    REASON FOR EXAM:    Lung Nodule Metastatic  COMMENTS:       PROCEDURE: PET - PET/CT DX LUNG CA  - May 01 2011  9:40AM     RESULT: Comparison: CT of the chest 04/20/2011    Radiopharmaceutical: 12.56 mCi F18-FDG, intravenously.    Technique: Imaging was performed from the skull base to the mid-thigh   using routine PET/CT acquisition protocol.    Injection site: Right wrist  Time of FDG injection: 0811 hours  Serum glucose: 141 mg/dL   Time of imaging: 5284 hours through 0940 hours    Findings:  Nohypermetabolic lymph nodes identified in the neck. There is minimal   hypermetabolic activity in the thyroid gland, which is nonspecific.    No hypermetabolic mediastinal, hilar, or axillary lymphadenopathy. There   is focal hypermetabolic activity the periphery of the right lung along   the posterior lateral aspect of the right major fissure. It measures SUV   max 9.0. There is focal soft tissue thickening extending from the pleura   at this region. While this could be in a location related to prior   thoracotomy or chest tube, the degree of activity is concerning for   malignancy. There is moderate to severe centrilobular emphysema. This is   greatest in the left upper lobe where there are several large bulla.   There is a peripherally hypermetabolic mass along the anterolateral     aspect of the right chest wall. This is in the region of the subcutaneous   fat, but extends to the adjacent to the rib. It measures SUV max 6.3.    There is a focal area of mild to moderate hypermetabolic activity in the   body of the pancreas. It measures SUV max 5.6. No definite mass seen on   the CT images. There is physiologic radiotracer activity within the   urinary system and bowel. There is mild to moderately increased   radiotracer activity in the muscle posterior to the left acetabulum.  There is mild thickening of the  muscle in this region on the CT images.   This is nonspecific. It measures SUV max is 6.5.    IMPRESSION:     1. Peripherally hypermetabolic mass in the subcutaneous fat along the   right anterolateral chest wall is consistent with the area of biopsied     malignancy.  2. Focal hypermetabolic soft tissue thickening along the periphery the   right major fissure is nonspecific, but concerning for malignancy given   its degree of metabolic activity.  3. Focus of mild to moderate hypermetabolic activity in the pancreatic   body is nonspecific. It is without definite CT correlate. However, given   the degree of activity, malignancy cannot be excluded.  4. Mild to moderate hypermetabolic activity in the muscle posterior to   the left acetabulum is nonspecific. There is mild thickening of this   muscle. This could be related to muscle strain/injury. However,   malignancy can not excluded. Further evaluation could be provided with   MRI, as indicated.          Verified By: Lewie ChamberOBERT L. SUBER, M.D., MD   Assessment and Plan:  Impression:   metastatic poorly differentiated carcinoma of the right chest wall from unknown primary and 64 year old male  Plan:   the stomach to start a course of palliative radiation therapy to his right chest wall. We will plan on delivering 3450 cGy in 15 fractions and evaluate for response. I will present his case at our weekly tumor conference. I am concerned about his left hip soft tissue mass which may also represent metastatic deposit and is is causing extreme pain. May also treat that area with palliative radiation therapy. Have also discussed the case personally with Dr. Orlie DakinFinnegan who will be starting systemic chemotherapy. Risks and benefits of chest wall treatment including skin reaction and slight lung toxicity was explained to the patient and his wife in both seem to comprehend my treatment plan well. I have set him up for CT simulation later this week.  I  would like to take this opportunity to thank you for allowing me to continue to participate in this patient's care.  CC Referral:   cc: Dr. Meredeth IdeFleming   Electronic Signatures: Rushie Chestnuthrystal, Gordy CouncilmanGlenn S (MD)  (Signed [id number]-Feb-13 14:54)  Authored: HPI, Diagnosis, PFSH, Allergies, Home Meds, ROS, Nursing Notes, Physical Exam, Other Results, Encounter Assessment and Plan, CC Referring Physician   Last Updated: 05-Feb-13 14:54 by Rebeca Alerthrystal, Charde Macfarlane S (MD)

## 2014-08-26 IMAGING — CT CT HEAD WITHOUT CONTRAST
2 series · 16 of 30 positions shown, 20 images · non-contrast
Comparison: none

REASON FOR EXAM: tia
COMMENTS:

PROCEDURE:     CT  - CT HEAD WITHOUT CONTRAST  - February 18, 2012  [DATE]
RESULT:     Comparison:  None
TECHNIQUE: Multiple axial images from the foramen magnum to the vertex were
obtained without IV contrast.

[Series 2: without · axial · non-contrast · 0.43mm/px · z∈[+16,+150]mm · 13 of 33 slices shown, 17 images]
[im 3/33  brain]
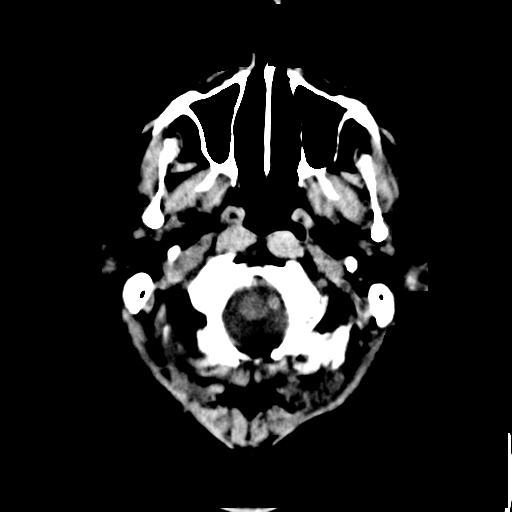
[im 3/33  bone]
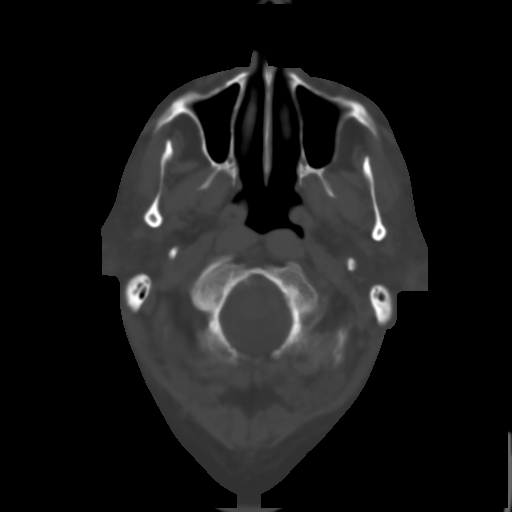
[im 5/33  brain]
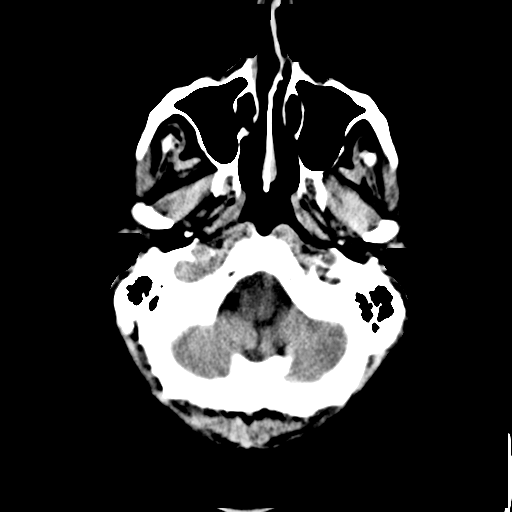
[im 7/33  brain]
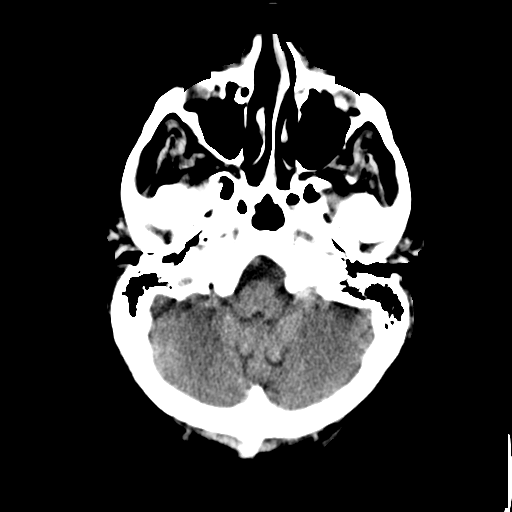
[im 10/33  brain]
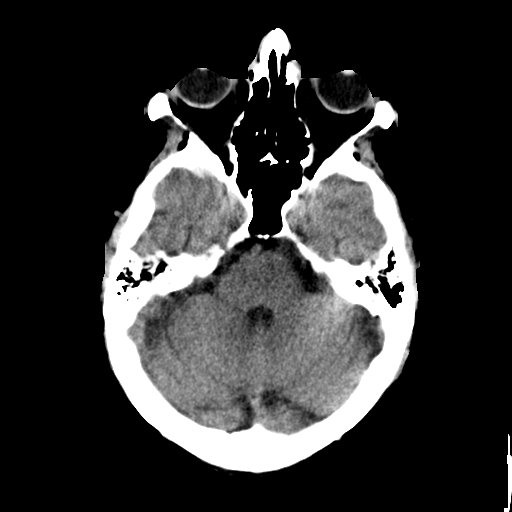
[im 12/33  brain]
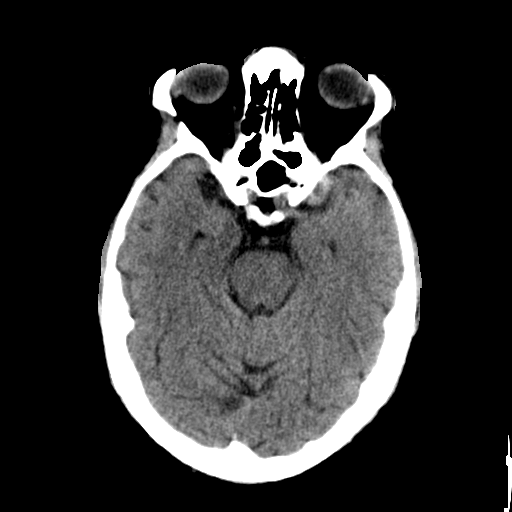
[im 12/33  bone]
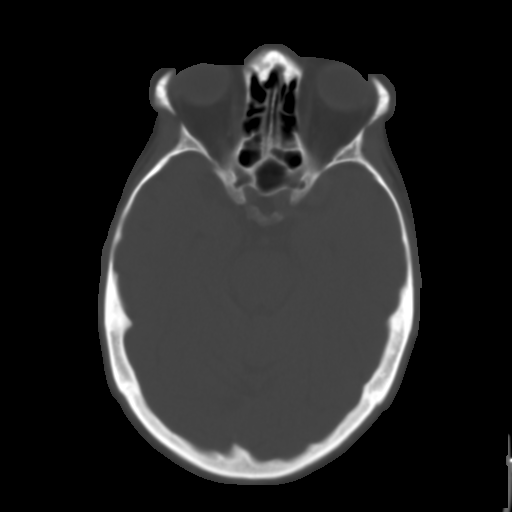
[im 14/33  brain]
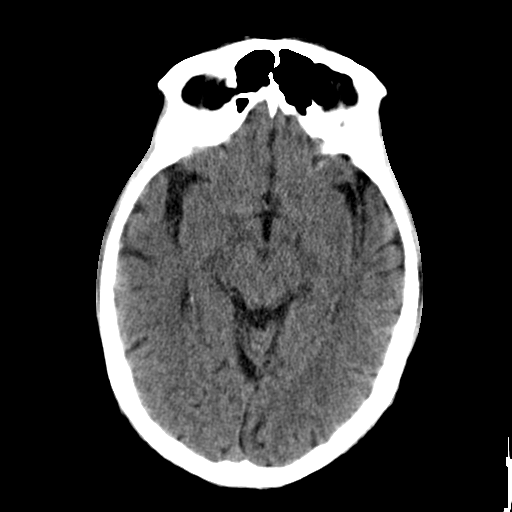
[im 17/33  brain]
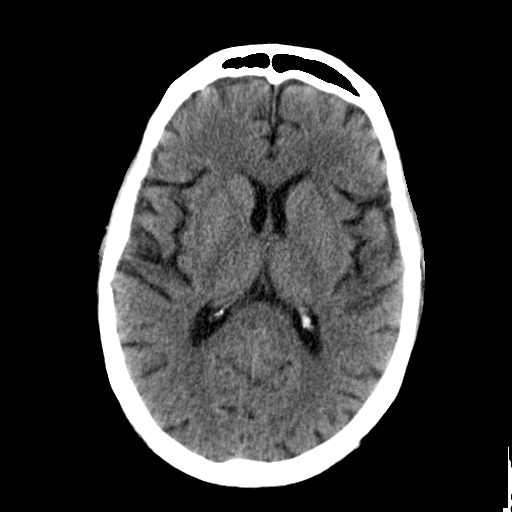
[im 19/33  brain]
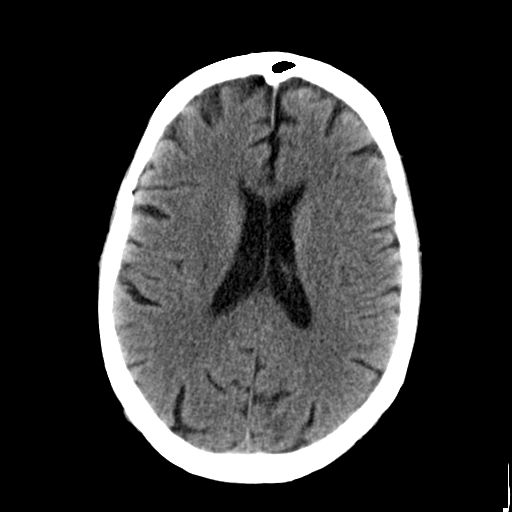
[im 21/33  brain]
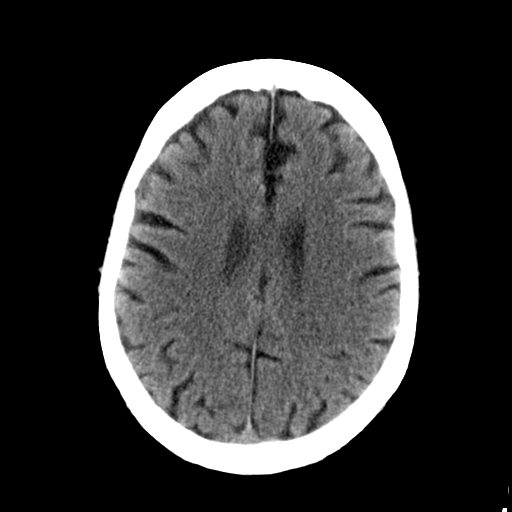
[im 21/33  bone]
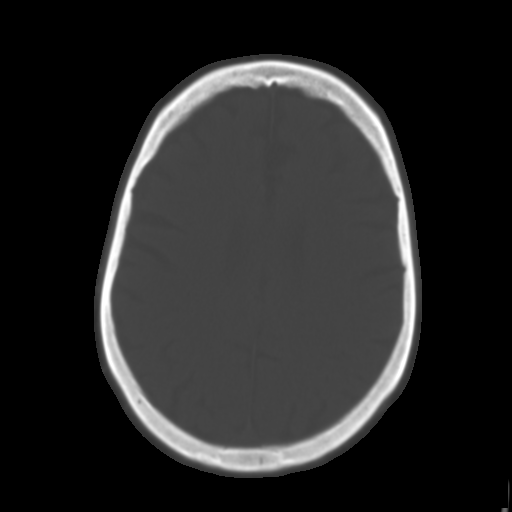
[im 23/33  brain]
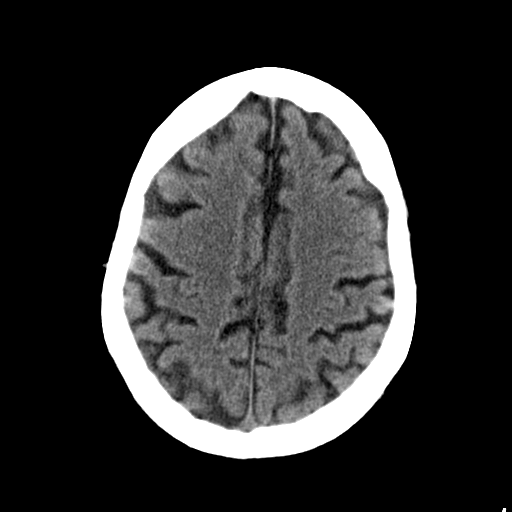
[im 26/33  brain]
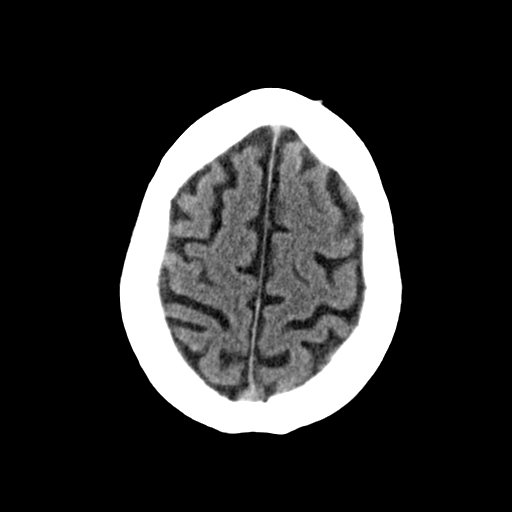
[im 28/33  brain]
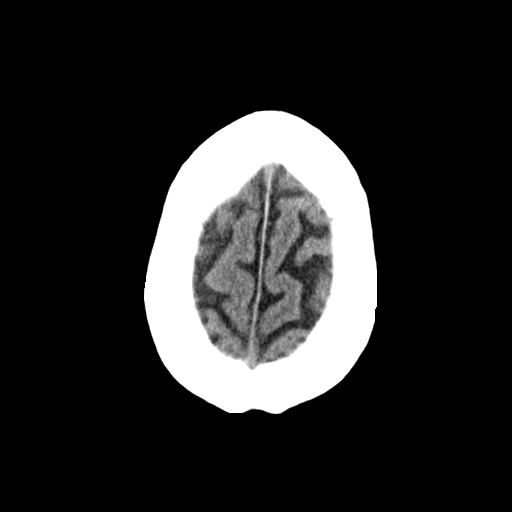
[im 30/33  brain]
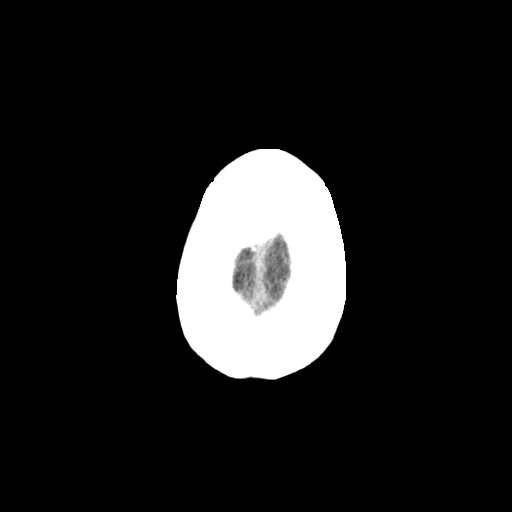
[im 30/33  bone]
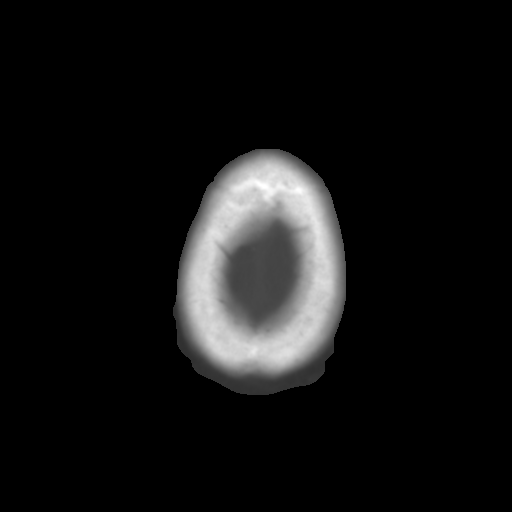

[Series 3: bone · axial · 0.43mm/px · z∈[+16,+60]mm · 3 of 33 slices shown]
[im 3/33  bone]
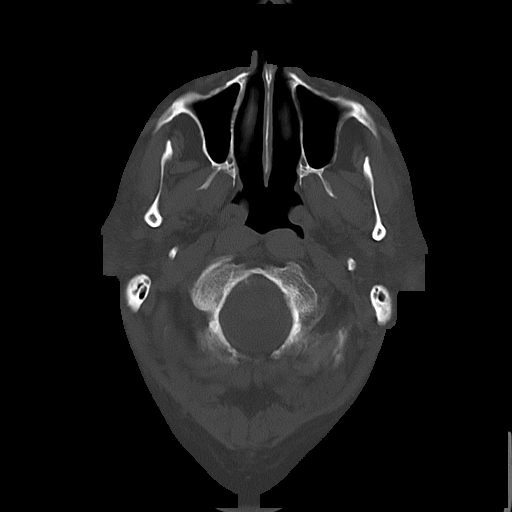
[im 7/33  bone]
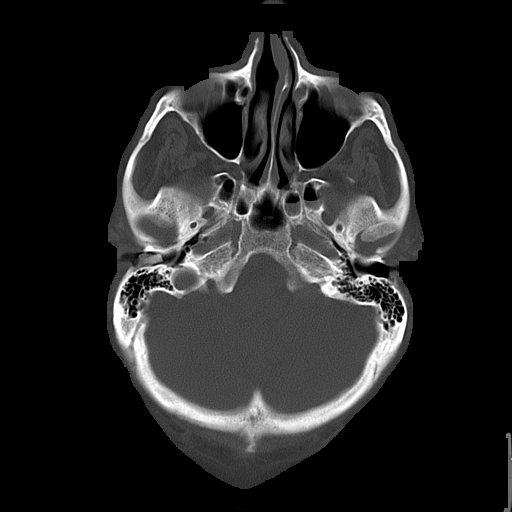
[im 12/33  bone]
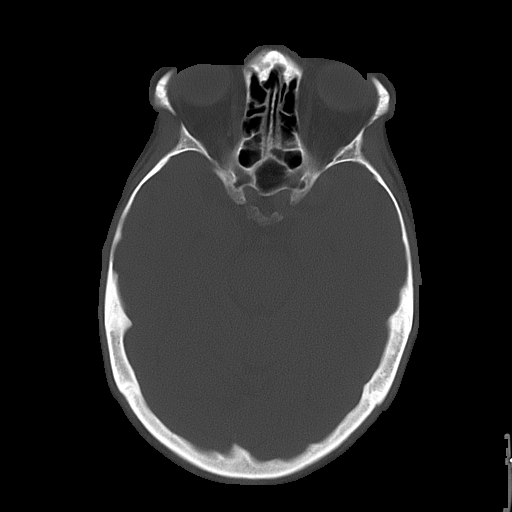

[16 of 30 positions shown; findings below may reference images not displayed]

FINDINGS: There is no evidence for mass effect, midline shift, or extra-axial fluid
collections. There is no evidence for space-occupying lesion, intracranial
hemorrhage, or cortical-based area of infarction. There is a small air-fluid
level in the left sphenoid sinus, which is nonspecific.

The osseous structures are unremarkable.
IMPRESSION: No acute intracranial process.

CT can underestimate ischemia in the first 24 hours after the event. If
there is clinical concern for an acute infarct, a followup MRI or repeat CT
scan in 24 hours may provide additional information.

[REDACTED]
